# Patient Record
Sex: Male | Born: 1945 | Hispanic: Yes | Smoking: Former smoker
Health system: Southern US, Community
[De-identification: ages and names within clinical notes are randomized; demographics above are authoritative.]

## PROBLEM LIST (undated history)

## (undated) DIAGNOSIS — I10 Essential (primary) hypertension: Secondary | ICD-10-CM

---

## 2011-08-27 ENCOUNTER — Other Ambulatory Visit (HOSPITAL_COMMUNITY): Payer: Self-pay | Admitting: Orthopaedic Surgery

## 2011-08-27 DIAGNOSIS — M25551 Pain in right hip: Secondary | ICD-10-CM

## 2011-08-29 ENCOUNTER — Encounter (HOSPITAL_COMMUNITY)
Admission: RE | Admit: 2011-08-29 | Discharge: 2011-08-29 | Disposition: A | Discharge status: Home / Self Care | Payer: Self-pay | Source: Ambulatory Visit | Attending: Orthopaedic Surgery | Admitting: Orthopaedic Surgery

## 2011-08-29 DIAGNOSIS — M25551 Pain in right hip: Secondary | ICD-10-CM

## 2011-08-29 DIAGNOSIS — M25559 Pain in unspecified hip: Secondary | ICD-10-CM | POA: Insufficient documentation

## 2011-08-29 DIAGNOSIS — Z96649 Presence of unspecified artificial hip joint: Secondary | ICD-10-CM | POA: Insufficient documentation

## 2011-08-29 MED ORDER — TECHNETIUM TC 99M MEDRONATE IV KIT
25.0000 | PACK | Freq: Once | INTRAVENOUS | Status: AC | PRN
  Administered 2011-08-29: 24 via INTRAVENOUS

## 2013-03-16 IMAGING — NM NM BONE 3 PHASE
2 series · 12 of 12 positions shown · non-contrast
Comparison: None.

CLINICAL DATA: Right hip pain.  Prior right hip replacement.
Evaluate for loosening.

NUCLEAR MEDICINE THREE PHASE BONE SCAN
TECHNIQUE: Radionuclide angiographic images, immediate static
blood pool images, and 3-hour delayed static images were obtained
after intravenous injection of radiopharmaceutical.
Radiopharmaceutical: [id number] LUSINE JIM TECHNETIUM TC 99M
MEDRONATE IV KIT

[Series 1: fl flow and static · 4.75mm/px · 6 of 40 frames shown (1 of 2)]
[frame 4/40  full-range]
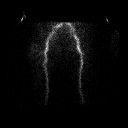
[frame 10/40  full-range]
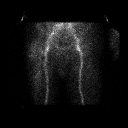
[frame 17/40  full-range]
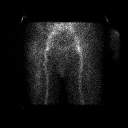
[frame 24/40  full-range]
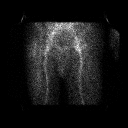
[frame 30/40  full-range]
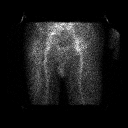
[frame 37/40  full-range]
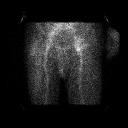

[Series 1: fl flow and static · 4.75mm/px · 6 of 40 frames shown (2 of 2)]
[frame 4/40  full-range]
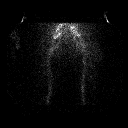
[frame 10/40  full-range]
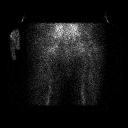
[frame 17/40  full-range]
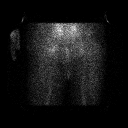
[frame 24/40  full-range]
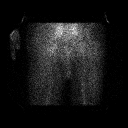
[frame 30/40  full-range]
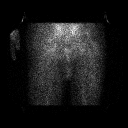
[frame 37/40  full-range]
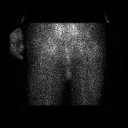

[12 of 12 positions shown; findings below may reference images not displayed]

FINDINGS: Flow images to the pelvis and upper aspect of the lower
extremities is symmetric and unremarkable.  Blood pool images
demonstrate increased uptake in the region of the left hip, likely
degenerative.  Delayed images demonstrate extensive abnormal
activity in the left hip compatible with degenerative changes.
Photopenic defect over the right hip.  Slight increased activity is
noted near the tip of the right femoral prosthesis suggesting
loosening.
IMPRESSION: Slight increased activity on delayed images at the tip of the right
femoral prosthesis suggesting early loosening.

Degenerative uptake in the left hip.

## 2018-03-02 ENCOUNTER — Ambulatory Visit (HOSPITAL_COMMUNITY)
Admission: EM | Admit: 2018-03-02 | Discharge: 2018-03-02 | Disposition: A | Discharge status: Home / Self Care | Payer: Self-pay

## 2018-03-02 ENCOUNTER — Ambulatory Visit (INDEPENDENT_AMBULATORY_CARE_PROVIDER_SITE_OTHER): Payer: Self-pay

## 2018-03-02 ENCOUNTER — Encounter (HOSPITAL_COMMUNITY): Payer: Self-pay | Admitting: Emergency Medicine

## 2018-03-02 ENCOUNTER — Other Ambulatory Visit: Payer: Self-pay

## 2018-03-02 DIAGNOSIS — Q790 Congenital diaphragmatic hernia: Secondary | ICD-10-CM

## 2018-03-02 DIAGNOSIS — I10 Essential (primary) hypertension: Secondary | ICD-10-CM | POA: Insufficient documentation

## 2018-03-02 DIAGNOSIS — R0600 Dyspnea, unspecified: Secondary | ICD-10-CM | POA: Insufficient documentation

## 2018-03-02 DIAGNOSIS — R030 Elevated blood-pressure reading, without diagnosis of hypertension: Secondary | ICD-10-CM

## 2018-03-02 HISTORY — DX: Essential (primary) hypertension: I10

## 2018-03-02 LAB — BRAIN NATRIURETIC PEPTIDE: B Natriuretic Peptide: 56.1 pg/mL (ref 0.0–100.0)

## 2018-03-02 MED ORDER — FUROSEMIDE 20 MG PO TABS: 20.0000 mg | ORAL_TABLET | Freq: Every day | ORAL | 0 refills | Status: AC

## 2018-03-02 MED ORDER — METOPROLOL SUCCINATE ER 25 MG PO TB24: 25.0000 mg | ORAL_TABLET | Freq: Two times a day (BID) | ORAL | 0 refills | Status: AC

## 2018-03-02 MED ORDER — ATORVASTATIN CALCIUM 20 MG PO TABS: 20.0000 mg | ORAL_TABLET | Freq: Every day | ORAL | 0 refills | Status: AC

## 2018-03-02 NOTE — Discharge Instructions (Addendum)
Los alimentos ricos en potasio son: Evalyn Casco secos, como cacahuetes y pistachos. Semillas, como semillas de girasol y de Land. Porotos, guisantes secos y lentejas. Granos enteros y panes y cereales con salvado. Nils Pyle y vegetales frescos como damascos, avocado, bananas, meln, kiwi, naranjas, esprragos y patatas. Jugos de naranja y tomates. Carnes rojas. Yogur con frutas.

## 2018-03-02 NOTE — ED Provider Notes (Addendum)
MRN: [id number] DOB: [birth year]  Subjective:   Jacorien Siple is a 73 y.o. male presenting for 1 month history of worsening, progressive dyspnea walking more than 20 feet. He has a history of HTN, amlodipine 5mg  once daily. He is from [place] visiting but will be leaving this Saturday.  Denies smoking cigarettes or drinking alcohol excessively.  He does have a PCP in [place] but is worried about having a heart problem right now.  No current facility-administered medications for this encounter.   Current Outpatient Medications:  .  amLODipine (NORVASC) 5 MG tablet, Take 5 mg by mouth daily., Disp: , Rfl:    No Known Allergies  Past Medical History:  Diagnosis Date  . Hypertension     Denies past surgical history.  Review of Systems  Constitutional: Negative for chills, diaphoresis, fever, malaise/fatigue and weight loss.  HENT: Negative for ear pain and sinus pain.   Eyes: Negative for blurred vision and double vision.  Respiratory: Negative for cough, hemoptysis, sputum production and wheezing.   Cardiovascular: Positive for PND. Negative for chest pain and palpitations.  Gastrointestinal: Negative for abdominal pain, constipation, nausea and vomiting.  Genitourinary: Negative for dysuria and hematuria.  Musculoskeletal: Negative for myalgias.  Skin: Negative for rash.  Neurological: Positive for dizziness. Negative for weakness and headaches.  Psychiatric/Behavioral: Negative for depression and substance abuse. The patient does not have insomnia.    Family History  Problem Relation Age of Onset  . Hypertension Father     Objective:   Vitals: BP (!) 168/82 (BP Location: Left Arm)   Pulse 85   Temp 97.8 F (36.6 C) (Oral)   Resp 18   SpO2 99%   Physical Exam Constitutional:      General: He is not in acute distress.    Appearance: Normal appearance. He is well-developed. He is not ill-appearing, toxic-appearing or diaphoretic.  HENT:     Head: Normocephalic and  atraumatic.     Right Ear: External ear normal.     Left Ear: External ear normal.     Nose: Nose normal.     Mouth/Throat:     Mouth: Mucous membranes are moist.     Pharynx: Oropharynx is clear.  Eyes:     General: No scleral icterus.    Extraocular Movements: Extraocular movements intact.     Pupils: Pupils are equal, round, and reactive to light.  Cardiovascular:     Rate and Rhythm: Normal rate and regular rhythm.     Heart sounds: Normal heart sounds. No murmur. No friction rub. No gallop.   Pulmonary:     Effort: Pulmonary effort is normal. No respiratory distress.     Breath sounds: Normal breath sounds. No stridor. No wheezing, rhonchi or rales.  Abdominal:     General: Bowel sounds are normal. There is no distension.     Palpations: Abdomen is soft.     Tenderness: There is no abdominal tenderness. There is no right CVA tenderness, left CVA tenderness, guarding or rebound.  Skin:    General: Skin is warm and dry.  Neurological:     Mental Status: He is alert and oriented to person, place, and time.  Psychiatric:        Mood and Affect: Mood normal.        Behavior: Behavior normal.        Thought Content: Thought content normal.    ED ECG REPORT   Date: 03/02/2018  Rate: 65bpm  Rhythm: normal sinus rhythm  QRS  Axis: indeterminate  Intervals: normal  ST/T Wave abnormalities: nonspecific T wave changes  Conduction Disutrbances:incomplete right bundle branch block  Narrative Interpretation: Q-waves in aVL, V1, V2; T-wave flattening in lead I, II, aVL, V5, V6. Sinus rhythm otherwise at 65bpm.   Old EKG Reviewed: none available  I have personally reviewed the EKG tracing and agree with the computerized printout as noted.  Dg Chest 2 View  Result Date: 03/02/2018 CLINICAL DATA:  Shortness of breath.  Hypertension. EXAM: CHEST - 2 VIEW COMPARISON:  None. FINDINGS: There is slight atelectatic change in the left base. There is no edema or consolidation. Heart is upper  normal in size with pulmonary vascularity normal. No adenopathy. 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 No bone lesions. IMPRESSION: Left base atelectasis. No edema or consolidation. Heart size normal. 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 Electronically Signed   By: Bretta Bang III M.D.   On: 03/02/2018 11:47   Assessment and Plan :   Dyspnea, unspecified type  Essential hypertension  Elevated blood pressure reading  I am concerned patient will not get care when he goes back to [place]. I have discussed patient's case extensively with Dr. Tracie Harrier, Dr. Hyacinth Meeker. We will increase his amlodipine, use Lasix for 1 week. I will have him start metoprolol and atorvastatin to help with cardiac remodeling and give patient a head start on managing what is likely hypertensive heart disease. Patient was in agreement with treatment plan. Strict ER precautions reviewed.    Wallis Bamberg, [place] 03/02/18 1207

## 2018-03-02 NOTE — ED Triage Notes (Addendum)
Pt reports SOB when getting up to walk. He denies any chest pain, but does have some dizziness at times. He is on HBP medication.  Pt is from [place] and has no medical records.  They only state that he has HBP and surgery on his right hip in the past.

## 2019-09-18 IMAGING — DX DG CHEST 2V
2 series · 2 of 2 positions shown · non-contrast
Comparison: None.

CLINICAL DATA: Shortness of breath.  Hypertension.

EXAM:
CHEST - 2 VIEW

[chest pa]
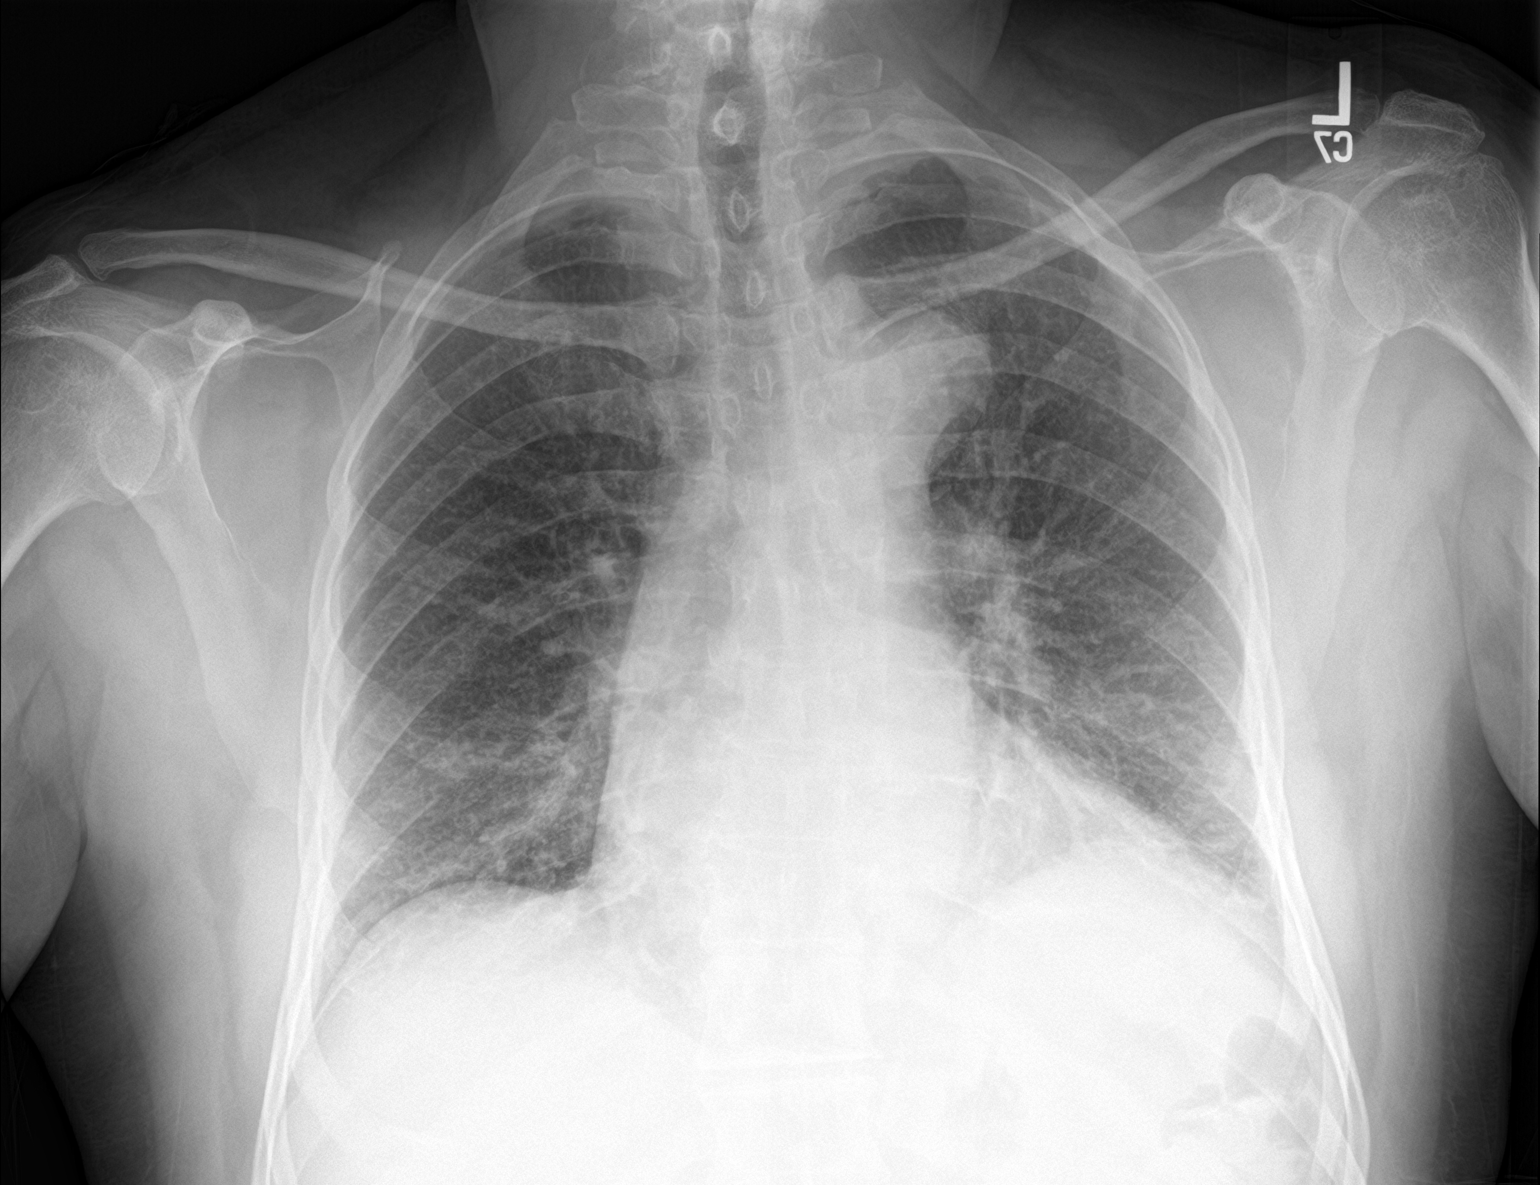

[chest lat]
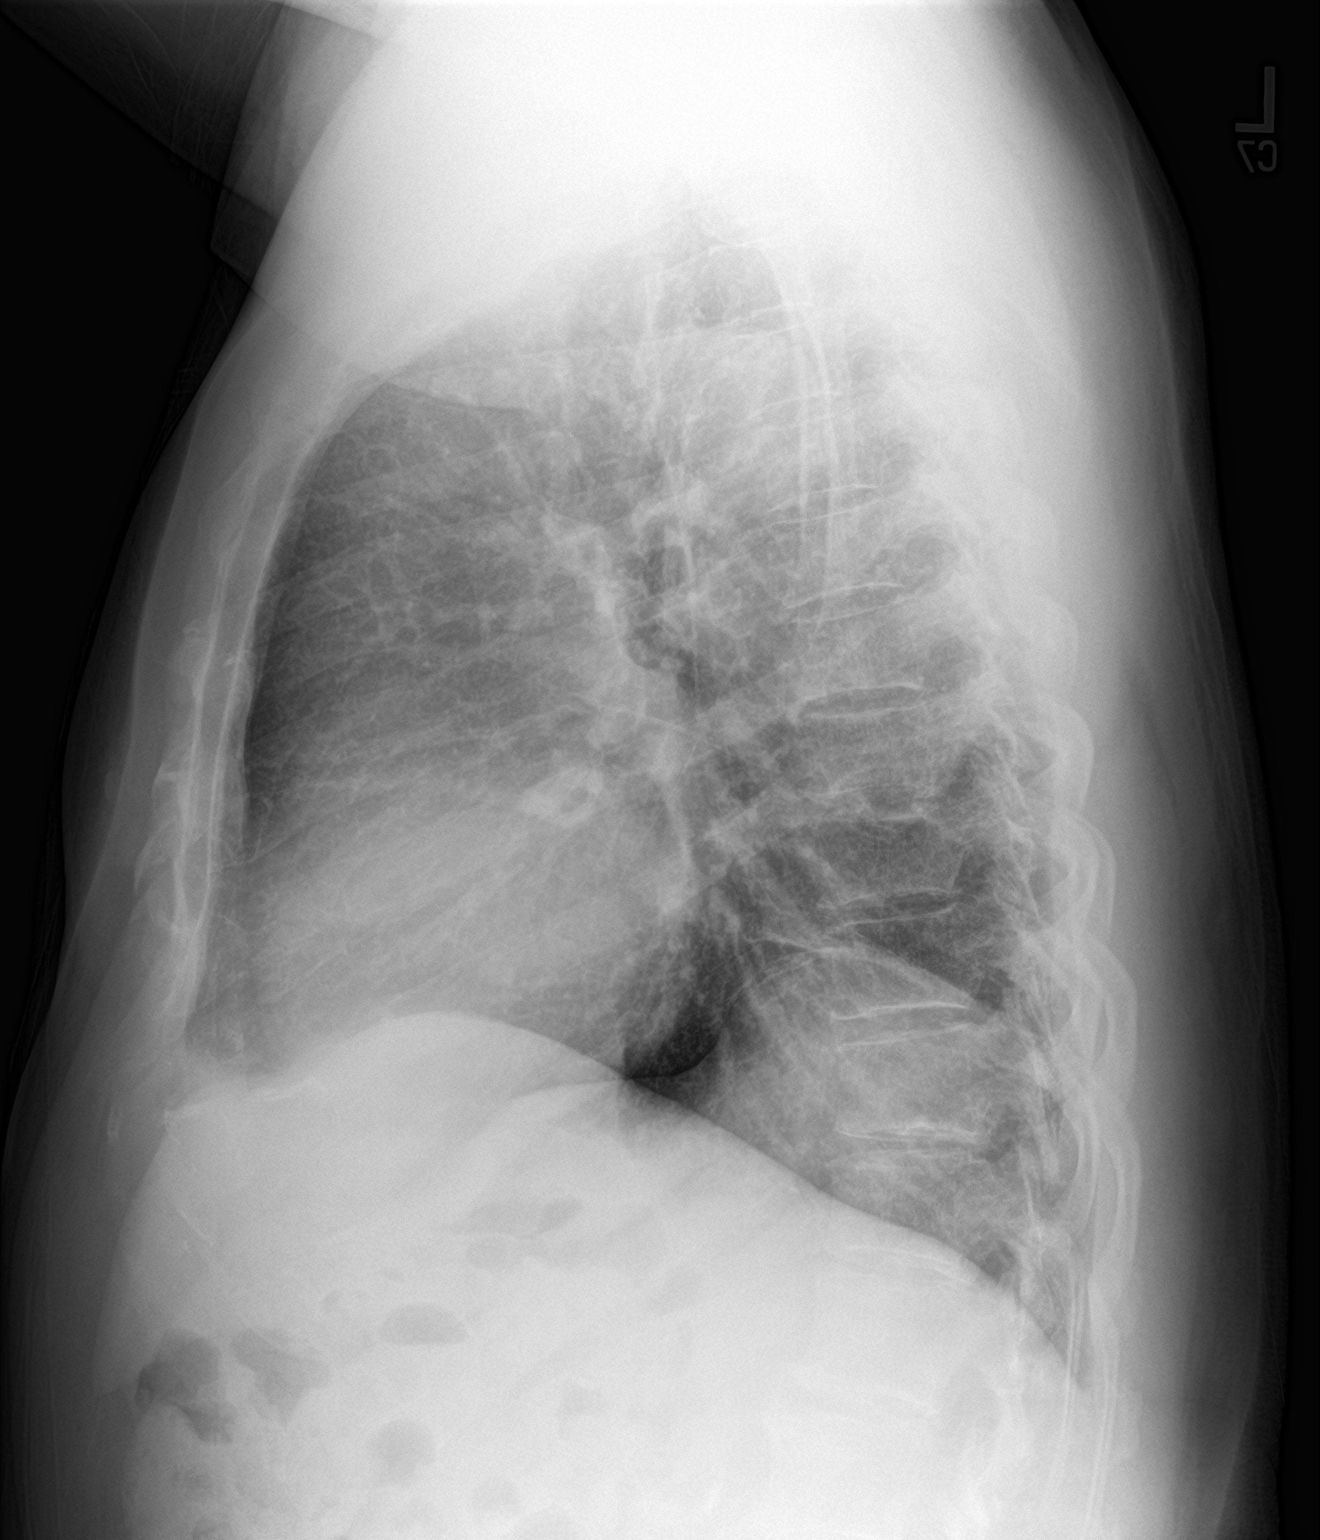

[2 of 2 positions shown; findings below may reference images not displayed]

FINDINGS: There is slight atelectatic change in the left base. There is no
edema or consolidation. Heart is upper normal in size with pulmonary
vascularity normal. No adenopathy. There is an apparent foramen of
Bochdalek hernia on the left posteriorly. No bone lesions.
IMPRESSION: Left base atelectasis. No edema or consolidation. Heart size normal.

Probable foramen of Bochdalek hernia on the left posteriorly.
